# Patient Record
Sex: Female | Born: 1965 | Hispanic: No | Marital: Married
Health system: Southern US, Community
[De-identification: ages and names within clinical notes are randomized; demographics above are authoritative.]

---

## 2017-12-11 ENCOUNTER — Encounter (INDEPENDENT_AMBULATORY_CARE_PROVIDER_SITE_OTHER): Payer: Self-pay

## 2017-12-11 ENCOUNTER — Ambulatory Visit: Payer: Self-pay | Attending: Oncology

## 2017-12-11 ENCOUNTER — Ambulatory Visit
Admission: RE | Admit: 2017-12-11 | Discharge: 2017-12-11 | Disposition: A | Discharge status: Home / Self Care | Payer: Self-pay | Source: Ambulatory Visit | Attending: Oncology | Admitting: Oncology

## 2017-12-11 VITALS — Ht 62.0 in | Wt 222.0 lb

## 2017-12-11 DIAGNOSIS — Z0000 Encounter for general adult medical examination without abnormal findings: Secondary | ICD-10-CM

## 2017-12-11 NOTE — Progress Notes (Addendum)
  Subjective:     Patient ID: Ashlee Stewart, female   DOB: March 25, 1965, 52 y.o.   MRN: [id number]  HPI   Review of Systems     Objective:   Physical Exam  Pulmonary/Chest: Right breast exhibits no inverted nipple, no mass, no nipple discharge, no skin change and no tenderness. Left breast exhibits no inverted nipple, no mass, no nipple discharge, no skin change and no tenderness. Breasts are symmetrical.    Risk Assessment    Risk Scores      12/11/2017   Last edited by: Scarlett Presto, RN   5-year risk: 1 %   Lifetime risk: 6.3 %             Assessment:     52 year old patient presents for [facility] clinic visit.  Patient screened, and meets B[facility] eligibility.  Patient does not have insurance, Medicare or Medicaid.  Handout given on Affordable Care Act.  Instructed patient on breast self awareness using teach back method.  Clinical breast exam unremarkable.  No mass or lump palpated.  Member of the [employer].      Plan:     Sent for bilateral screening mammogram.

## 2017-12-20 NOTE — Progress Notes (Unsigned)
Letter mailed from [facility] to notify of normal mammogram results.  Patient to return in one year for annual screening.  Copy to [facility]. 

## 2019-05-27 ENCOUNTER — Other Ambulatory Visit: Payer: Self-pay | Admitting: Physician Assistant

## 2019-05-27 DIAGNOSIS — Z1231 Encounter for screening mammogram for malignant neoplasm of breast: Secondary | ICD-10-CM

## 2019-08-08 ENCOUNTER — Ambulatory Visit
Admission: RE | Admit: 2019-08-08 | Discharge: 2019-08-08 | Disposition: A | Discharge status: Home / Self Care | Payer: BC Managed Care – PPO | Source: Ambulatory Visit | Attending: Physician Assistant | Admitting: Physician Assistant

## 2019-08-08 DIAGNOSIS — Z1231 Encounter for screening mammogram for malignant neoplasm of breast: Secondary | ICD-10-CM | POA: Insufficient documentation

## 2019-08-21 ENCOUNTER — Other Ambulatory Visit: Payer: Self-pay | Admitting: Physician Assistant

## 2019-08-21 DIAGNOSIS — N6489 Other specified disorders of breast: Secondary | ICD-10-CM

## 2019-08-21 DIAGNOSIS — R928 Other abnormal and inconclusive findings on diagnostic imaging of breast: Secondary | ICD-10-CM

## 2019-08-22 ENCOUNTER — Ambulatory Visit
Admission: RE | Admit: 2019-08-22 | Discharge: 2019-08-22 | Disposition: A | Discharge status: Home / Self Care | Payer: BC Managed Care – PPO | Source: Ambulatory Visit | Attending: Physician Assistant | Admitting: Physician Assistant

## 2019-08-22 DIAGNOSIS — N6489 Other specified disorders of breast: Secondary | ICD-10-CM | POA: Insufficient documentation

## 2019-08-22 DIAGNOSIS — R928 Other abnormal and inconclusive findings on diagnostic imaging of breast: Secondary | ICD-10-CM

## 2019-08-28 ENCOUNTER — Other Ambulatory Visit: Payer: Self-pay | Admitting: Physician Assistant

## 2019-08-28 DIAGNOSIS — N6489 Other specified disorders of breast: Secondary | ICD-10-CM

## 2019-08-28 DIAGNOSIS — R928 Other abnormal and inconclusive findings on diagnostic imaging of breast: Secondary | ICD-10-CM

## 2019-09-03 ENCOUNTER — Ambulatory Visit
Admission: RE | Admit: 2019-09-03 | Discharge: 2019-09-03 | Disposition: A | Discharge status: Home / Self Care | Payer: BC Managed Care – PPO | Source: Ambulatory Visit | Attending: Physician Assistant | Admitting: Physician Assistant

## 2019-09-03 DIAGNOSIS — R928 Other abnormal and inconclusive findings on diagnostic imaging of breast: Secondary | ICD-10-CM | POA: Insufficient documentation

## 2019-09-03 DIAGNOSIS — N6489 Other specified disorders of breast: Secondary | ICD-10-CM | POA: Diagnosis present

## 2019-09-03 HISTORY — PX: BREAST BIOPSY: SHX20

## 2019-09-04 LAB — SURGICAL PATHOLOGY

## 2020-08-05 ENCOUNTER — Other Ambulatory Visit: Payer: Self-pay | Admitting: Physician Assistant

## 2020-08-05 DIAGNOSIS — Z1231 Encounter for screening mammogram for malignant neoplasm of breast: Secondary | ICD-10-CM

## 2021-01-12 IMAGING — US US BREAST*L* LIMITED INC AXILLA
1 series · 5 of 5 positions shown · non-contrast
Comparison: Previous exam(s).

CLINICAL DATA: Screening recall for a left breast asymmetry.

EXAM:
DIGITAL DIAGNOSTIC UNILATERAL LEFT MAMMOGRAM WITH CAD AND TOMO
LEFT BREAST ULTRASOUND

[Series 1: us breast*left* limited inc axilla · 0.09mm/px · 5 of 5 slices shown]
[im 1/5]
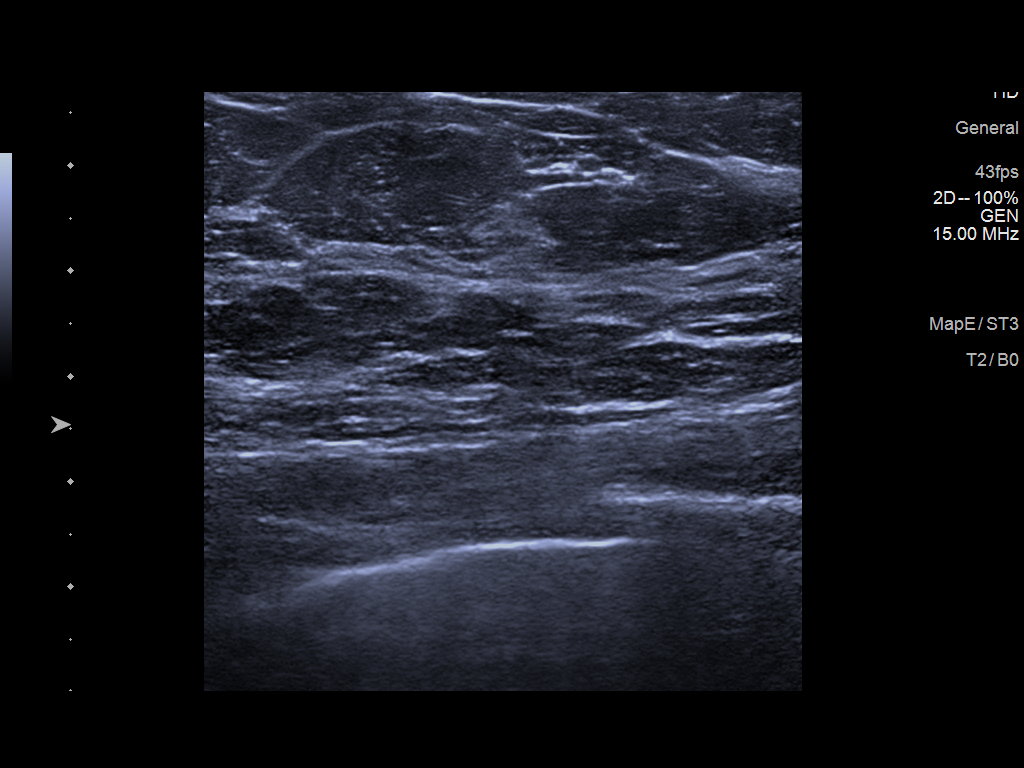
[im 2/5]
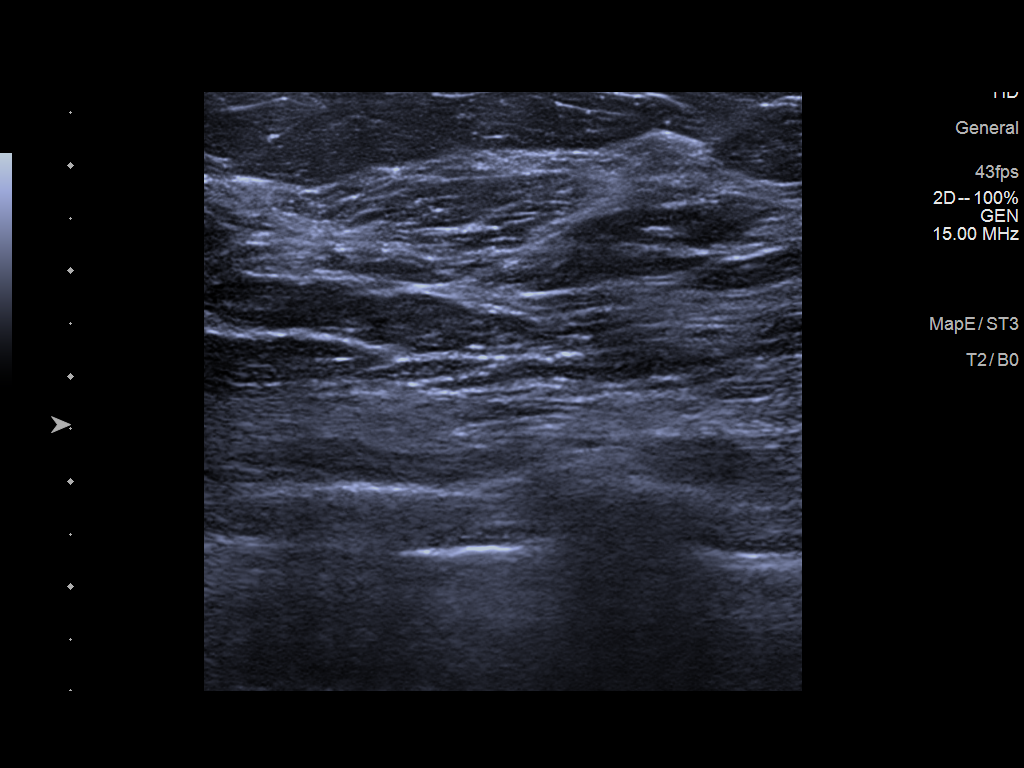
[im 3/5]
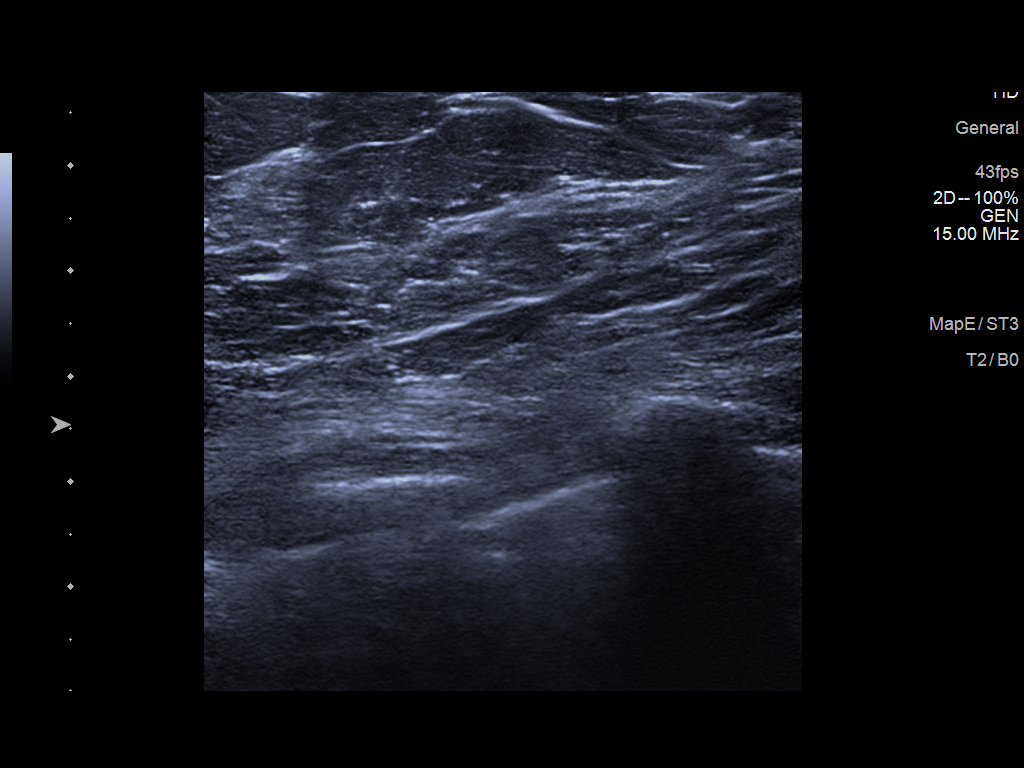
[im 4/5]
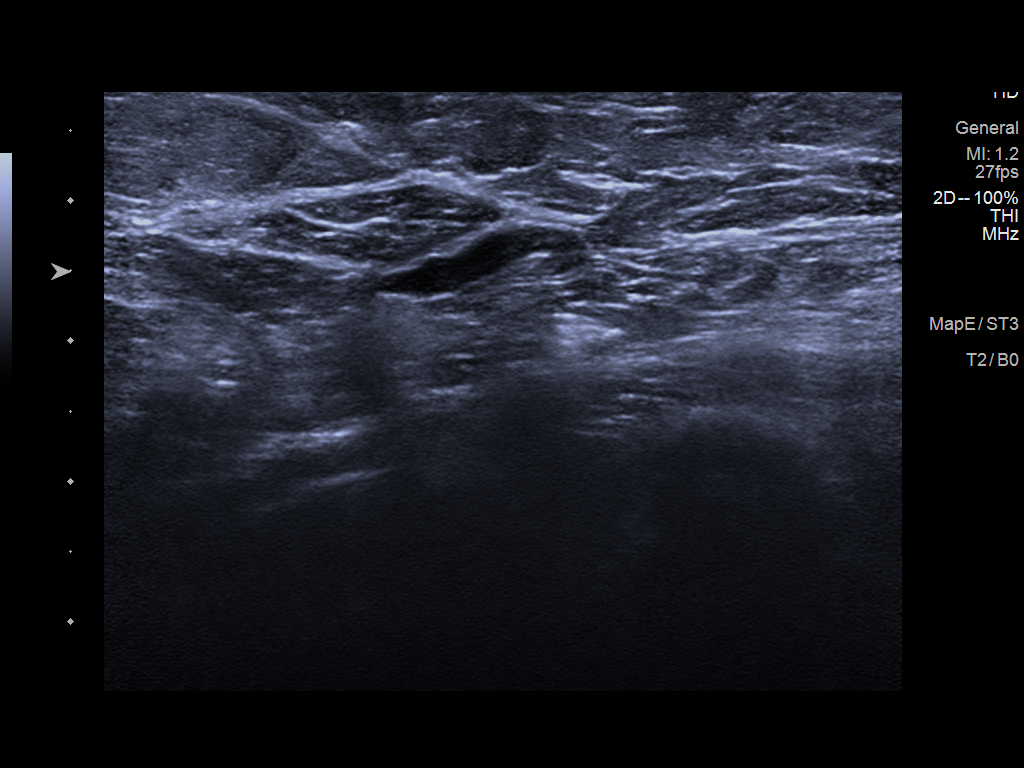
[im 5/5]
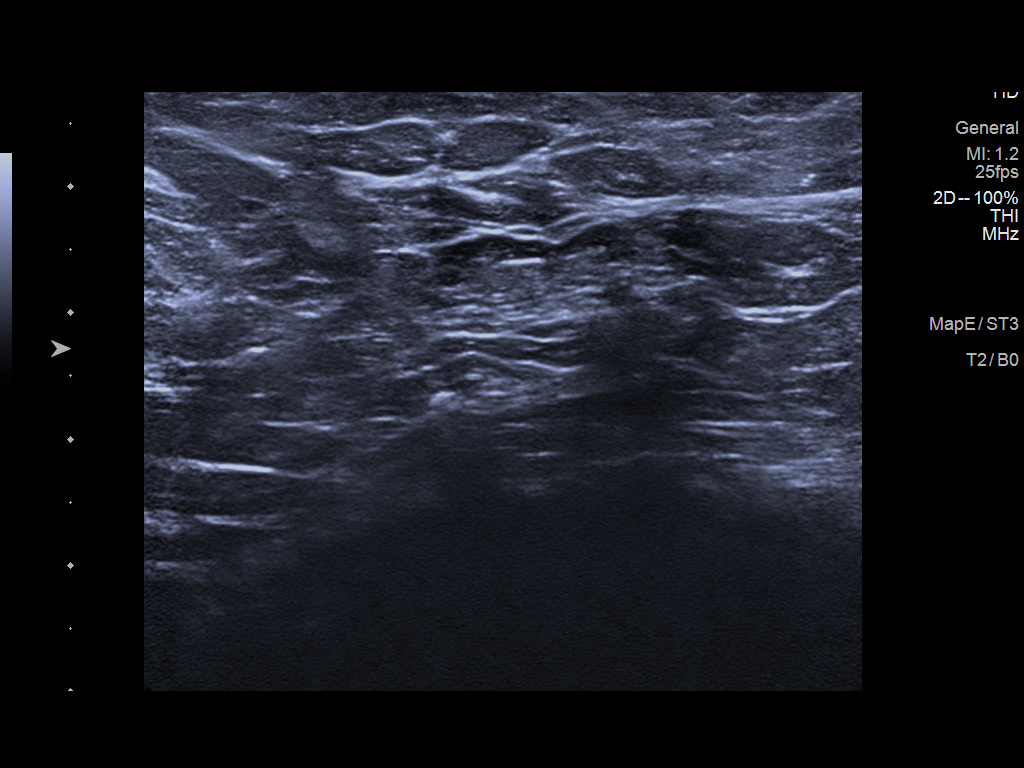

[5 of 5 positions shown; findings below may reference images not displayed]

ACR Breast Density Category b: There are scattered areas of
fibroglandular density.
FINDINGS: Spot compression tomosynthesis images in the CC projection
demonstrates a persistent subtle asymmetry. No definite correlate is
seen on the lateral views.

Mammographic images were processed with CAD.

Ultrasound targeted to the lateral left breast demonstrates normal
fibroglandular tissue. No suspicious masses or areas of shadowing
are identified. Ultrasound of the left axilla demonstrates multiple
normal-appearing lymph nodes.
IMPRESSION: 1. There is an indeterminate asymmetry in the lateral left breast
without a sonographic correlate.

2.  No evidence of left axillary lymphadenopathy.

RECOMMENDATION:
Stereotactic biopsy is recommended for the left breast asymmetry.

I have discussed the findings and recommendations with the patient.
If applicable, a reminder letter will be sent to the patient
regarding the next appointment.

BI-RADS CATEGORY  4: Suspicious.

## 2021-02-12 LAB — COLOGUARD: COLOGUARD: NEGATIVE

## 2021-07-06 ENCOUNTER — Other Ambulatory Visit: Payer: Self-pay | Admitting: Family

## 2021-07-06 DIAGNOSIS — Z1231 Encounter for screening mammogram for malignant neoplasm of breast: Secondary | ICD-10-CM

## 2021-07-19 ENCOUNTER — Ambulatory Visit
Admission: RE | Admit: 2021-07-19 | Discharge: 2021-07-19 | Disposition: A | Discharge status: Home / Self Care | Payer: 59 | Source: Ambulatory Visit | Attending: Family | Admitting: Family

## 2021-07-19 DIAGNOSIS — Z1231 Encounter for screening mammogram for malignant neoplasm of breast: Secondary | ICD-10-CM | POA: Diagnosis present

## 2022-07-07 ENCOUNTER — Other Ambulatory Visit: Payer: Self-pay | Admitting: Family

## 2022-07-07 DIAGNOSIS — Z1231 Encounter for screening mammogram for malignant neoplasm of breast: Secondary | ICD-10-CM

## 2022-08-02 ENCOUNTER — Ambulatory Visit
Admission: RE | Admit: 2022-08-02 | Discharge: 2022-08-02 | Disposition: A | Discharge status: Home / Self Care | Payer: 59 | Source: Ambulatory Visit | Attending: Internal Medicine | Admitting: Internal Medicine

## 2022-08-02 DIAGNOSIS — Z1231 Encounter for screening mammogram for malignant neoplasm of breast: Secondary | ICD-10-CM

## 2022-12-10 IMAGING — MG MM DIGITAL SCREENING BILAT W/ TOMO AND CAD
8 series · 8 of 24 positions shown · non-contrast
Comparison: Previous exam(s).

CLINICAL DATA: Screening.

EXAM:
DIGITAL SCREENING BILATERAL MAMMOGRAM WITH TOMOSYNTHESIS AND CAD
TECHNIQUE: Bilateral screening digital craniocaudal and mediolateral oblique
mammograms were obtained. Bilateral screening digital breast
tomosynthesis was performed. The images were evaluated with
computer-aided detection.

[R CC synth-2D]
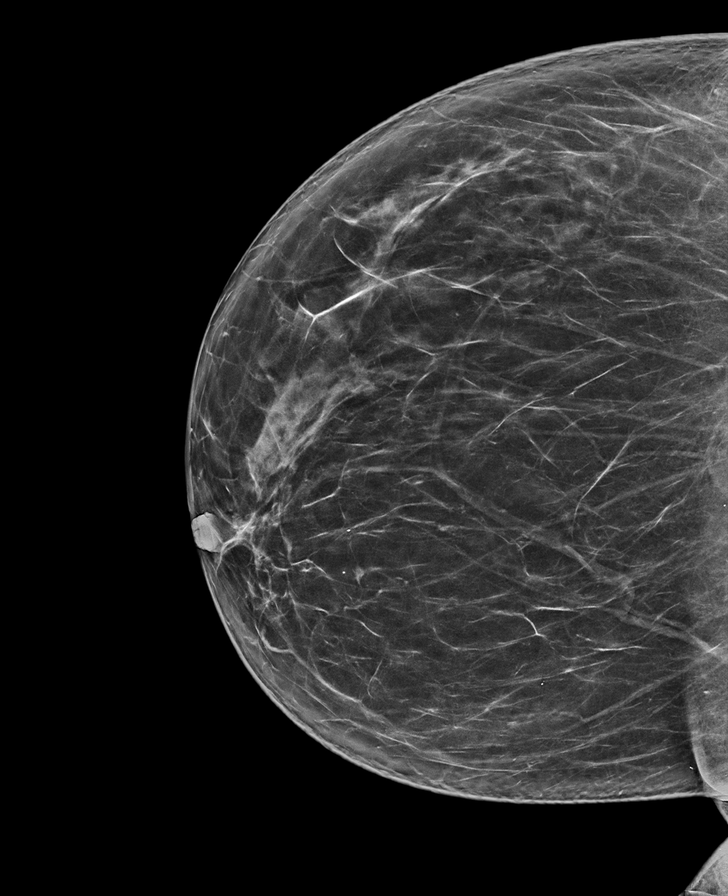

[L CC synth-2D]
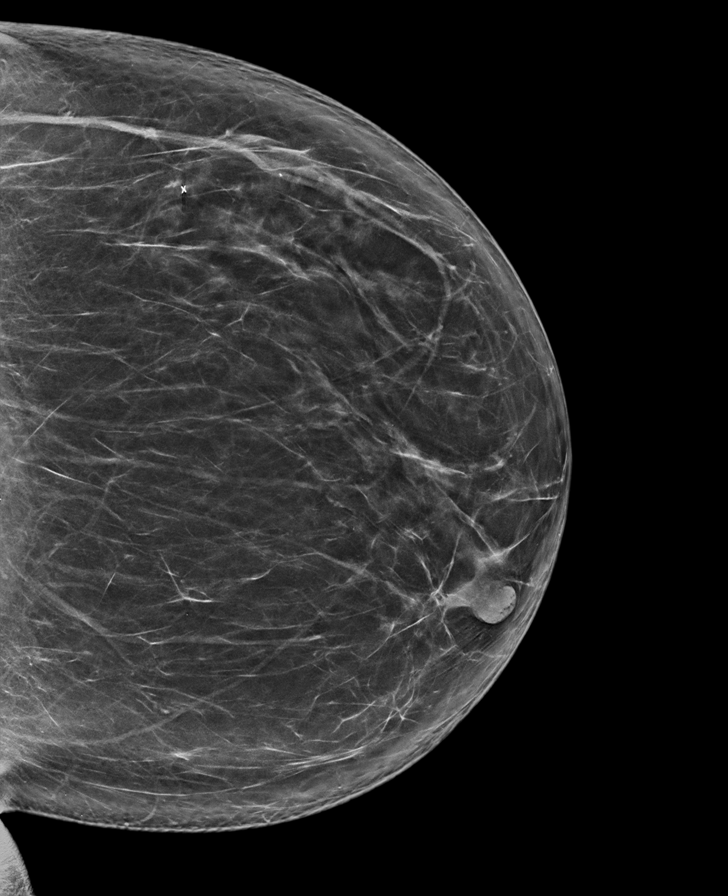

[L MLO synth-2D]
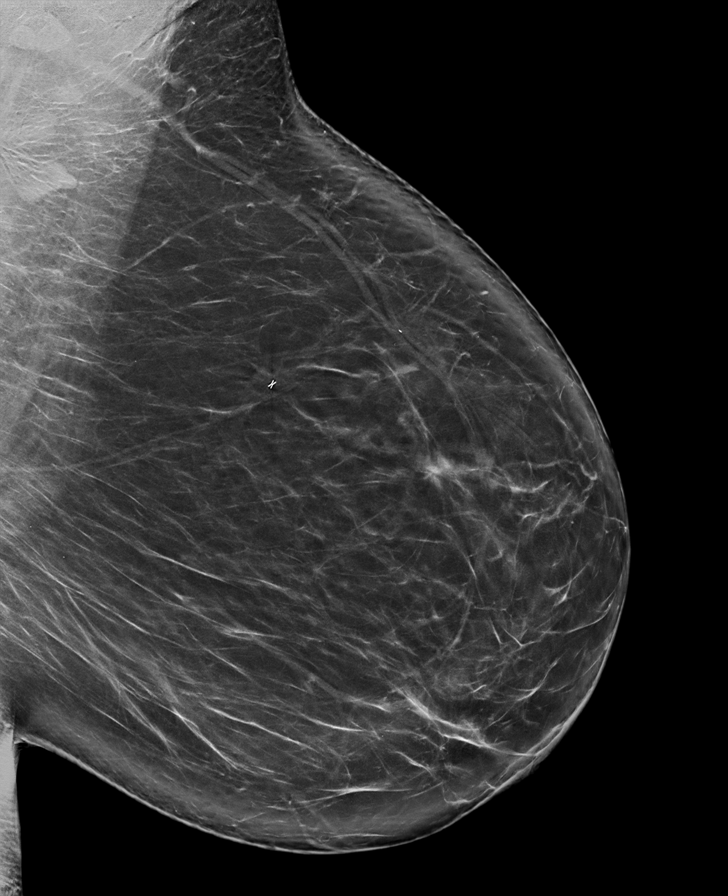

[R MLO synth-2D]
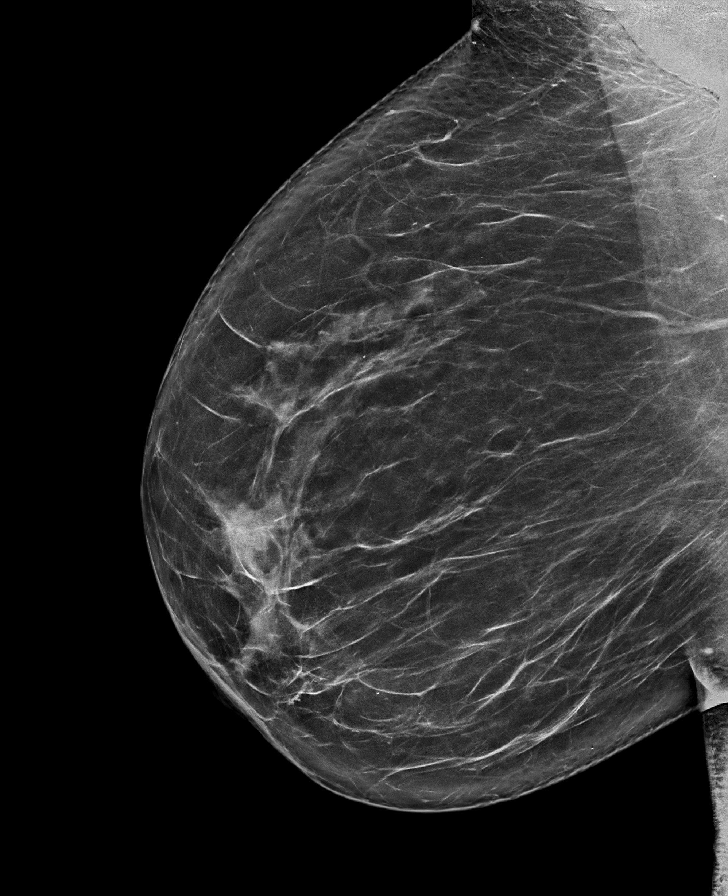

[L CC tomo · tomo slice 43/86.0]
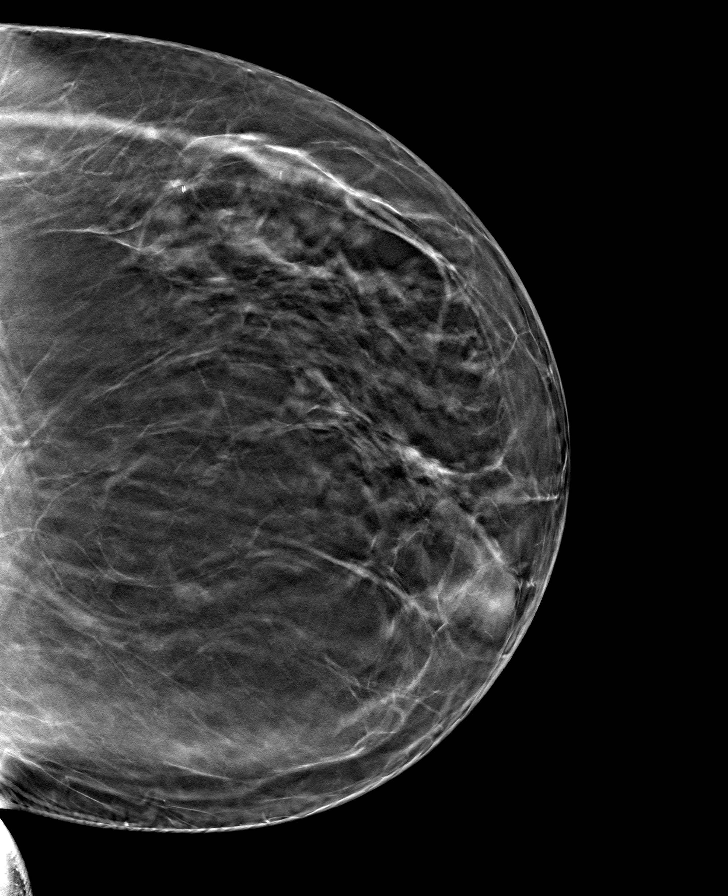

[R CC tomo · tomo slice 41/80.0]
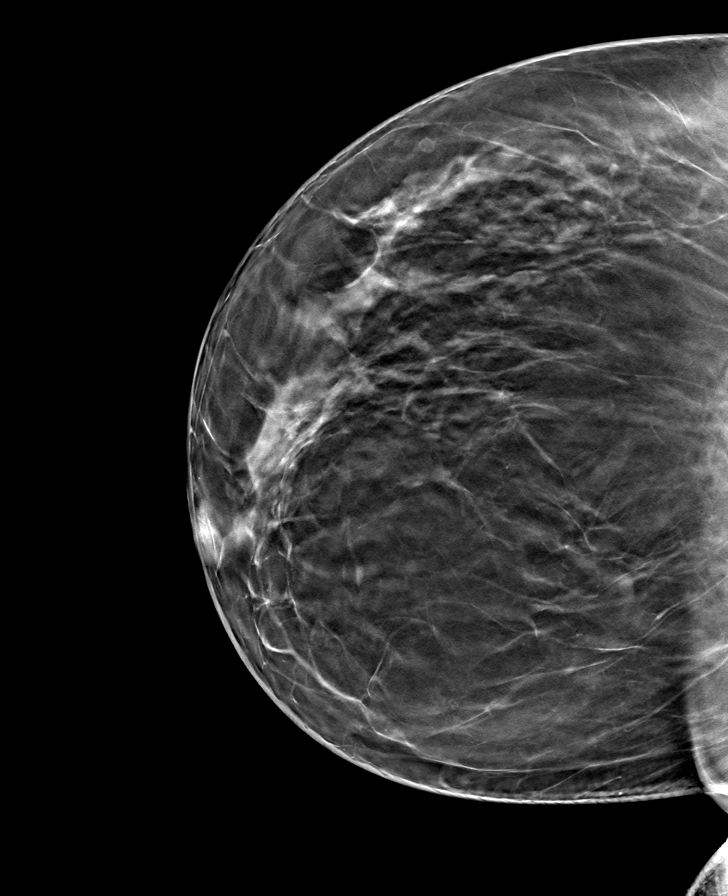

[R MLO tomo · tomo slice 45/90.0]
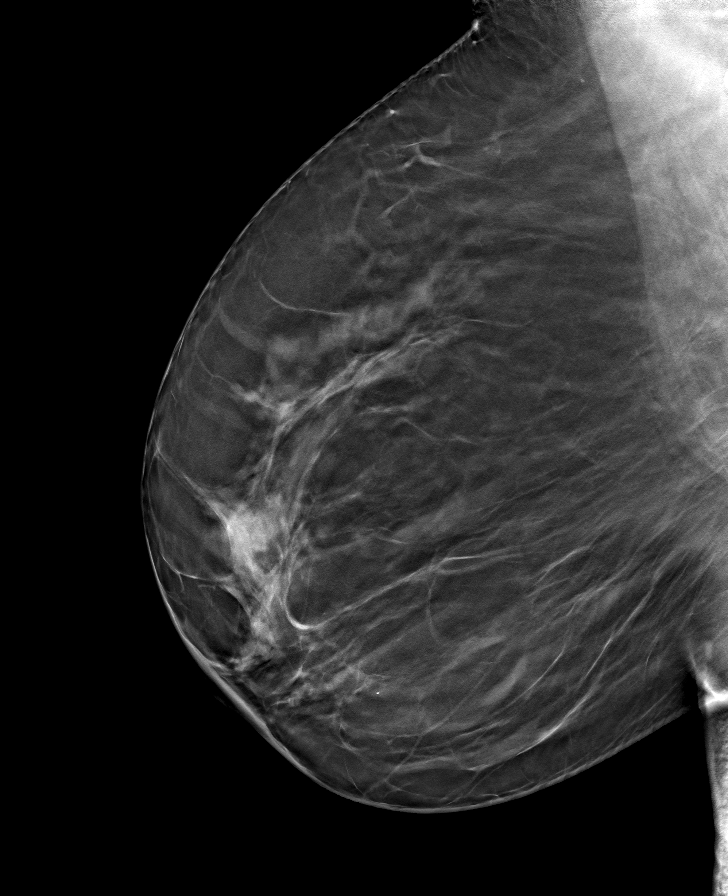

[L MLO tomo · tomo slice 51/100.0]
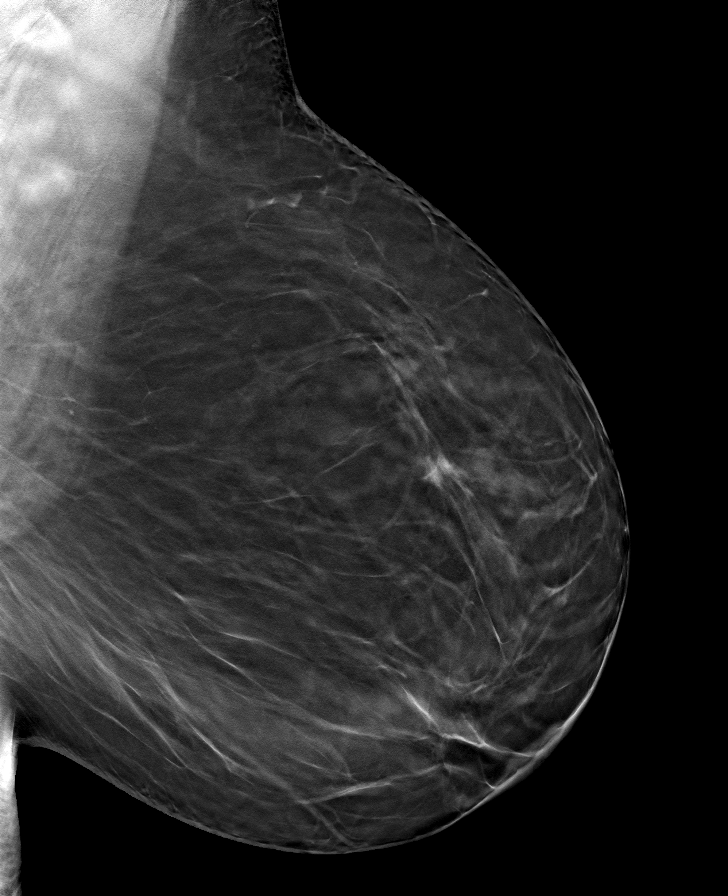

[8 of 24 positions shown; findings below may reference images not displayed]

ACR Breast Density Category b: There are scattered areas of
fibroglandular density.
FINDINGS: There are no findings suspicious for malignancy.
IMPRESSION: No mammographic evidence of malignancy. A result letter of this
screening mammogram will be mailed directly to the patient.

RECOMMENDATION:
Screening mammogram in one year. (Code:51-O-LD2)

BI-RADS CATEGORY  1: Negative.

## 2023-06-19 ENCOUNTER — Other Ambulatory Visit: Payer: Self-pay | Admitting: Family

## 2023-06-19 DIAGNOSIS — Z1231 Encounter for screening mammogram for malignant neoplasm of breast: Secondary | ICD-10-CM

## 2023-07-14 ENCOUNTER — Other Ambulatory Visit: Payer: Self-pay | Admitting: Family

## 2023-07-14 DIAGNOSIS — N950 Postmenopausal bleeding: Secondary | ICD-10-CM

## 2023-07-21 ENCOUNTER — Ambulatory Visit
Admission: RE | Admit: 2023-07-21 | Discharge: 2023-07-21 | Disposition: A | Discharge status: Home / Self Care | Source: Ambulatory Visit | Attending: Family | Admitting: Family

## 2023-07-21 DIAGNOSIS — N950 Postmenopausal bleeding: Secondary | ICD-10-CM | POA: Diagnosis present

## 2023-08-09 ENCOUNTER — Other Ambulatory Visit: Payer: Self-pay | Admitting: Family

## 2023-08-09 ENCOUNTER — Ambulatory Visit
Admission: RE | Admit: 2023-08-09 | Discharge: 2023-08-09 | Disposition: A | Discharge status: Home / Self Care | Source: Ambulatory Visit | Attending: Family | Admitting: Family

## 2023-08-09 DIAGNOSIS — Z1231 Encounter for screening mammogram for malignant neoplasm of breast: Secondary | ICD-10-CM | POA: Diagnosis present

## 2023-10-03 LAB — COLOGUARD: COLOGUARD: NEGATIVE
